# Patient Record
Sex: Female | Born: 1990 | Race: Black or African American | Hispanic: No | Marital: Single | Smoking: Never smoker
Health system: Southern US, Community
[De-identification: ages and names within clinical notes are randomized; demographics above are authoritative.]

## PROBLEM LIST (undated history)

## (undated) DIAGNOSIS — E119 Type 2 diabetes mellitus without complications: Secondary | ICD-10-CM

---

## 2018-11-09 ENCOUNTER — Encounter: Payer: Self-pay | Admitting: Emergency Medicine

## 2018-11-09 ENCOUNTER — Emergency Department
Admission: EM | Admit: 2018-11-09 | Discharge: 2018-11-09 | Disposition: A | Discharge status: Home / Self Care | Payer: Managed Care, Other (non HMO) | Attending: Emergency Medicine | Admitting: Emergency Medicine

## 2018-11-09 ENCOUNTER — Emergency Department: Payer: Managed Care, Other (non HMO)

## 2018-11-09 ENCOUNTER — Other Ambulatory Visit: Payer: Self-pay

## 2018-11-09 DIAGNOSIS — R590 Localized enlarged lymph nodes: Secondary | ICD-10-CM | POA: Insufficient documentation

## 2018-11-09 DIAGNOSIS — Z7984 Long term (current) use of oral hypoglycemic drugs: Secondary | ICD-10-CM | POA: Insufficient documentation

## 2018-11-09 DIAGNOSIS — E119 Type 2 diabetes mellitus without complications: Secondary | ICD-10-CM | POA: Insufficient documentation

## 2018-11-09 DIAGNOSIS — R221 Localized swelling, mass and lump, neck: Secondary | ICD-10-CM | POA: Diagnosis present

## 2018-11-09 HISTORY — DX: Type 2 diabetes mellitus without complications: E11.9

## 2018-11-09 LAB — LIPASE, BLOOD: Lipase: 51 U/L (ref 11–51)

## 2018-11-09 LAB — COMPREHENSIVE METABOLIC PANEL
ALT: 21 U/L (ref 0–44)
AST: 24 U/L (ref 15–41)
Albumin: 3.8 g/dL (ref 3.5–5.0)
Alkaline Phosphatase: 53 U/L (ref 38–126)
Anion gap: 8 (ref 5–15)
BUN: 12 mg/dL (ref 6–20)
CO2: 26 mmol/L (ref 22–32)
Calcium: 9.1 mg/dL (ref 8.9–10.3)
Chloride: 106 mmol/L (ref 98–111)
Creatinine, Ser: 0.78 mg/dL (ref 0.44–1.00)
GFR calc Af Amer: >60 mL/min (ref >60)
Glucose, Bld: 87 mg/dL (ref 70–99)
Potassium: 3.6 mmol/L (ref 3.5–5.1)
Sodium: 140 mmol/L (ref 135–145)
Total Bilirubin: 0.6 mg/dL (ref 0.3–1.2)
Total Protein: 7.6 g/dL (ref 6.5–8.1)

## 2018-11-09 LAB — CBC WITH DIFFERENTIAL/PLATELET
Abs Immature Granulocytes: 0.01 10*3/uL (ref 0.00–0.07)
Basophils Absolute: 0 10*3/uL (ref 0.0–0.1)
Basophils Relative: 0 %
EOS ABS: 0 10*3/uL (ref 0.0–0.5)
Eosinophils Relative: 0 %
HCT: 39.9 % (ref 36.0–46.0)
Hemoglobin: 12.8 g/dL (ref 12.0–15.0)
IMMATURE GRANULOCYTES: 0 %
Lymphocytes Relative: 43 %
Lymphs Abs: 2.3 10*3/uL (ref 0.7–4.0)
MCH: 28.4 pg (ref 26.0–34.0)
MCHC: 32.1 g/dL (ref 30.0–36.0)
MCV: 88.7 fL (ref 80.0–100.0)
Monocytes Absolute: 0.5 10*3/uL (ref 0.1–1.0)
Monocytes Relative: 9 %
NEUTROS PCT: 48 %
Neutro Abs: 2.6 10*3/uL (ref 1.7–7.7)
PLATELETS: 258 10*3/uL (ref 150–400)
RBC: 4.5 MIL/uL (ref 3.87–5.11)
RDW: 12.2 % (ref 11.5–15.5)
WBC: 5.4 10*3/uL (ref 4.0–10.5)
nRBC: 0 % (ref 0.0–0.2)

## 2018-11-09 MED ORDER — PREDNISONE 10 MG PO TABS: 10.0000 mg | ORAL_TABLET | Freq: Every day | ORAL | 0 refills | Status: AC

## 2018-11-09 MED ORDER — IOHEXOL 300 MG/ML  SOLN
100.0000 mL | Freq: Once | INTRAMUSCULAR | Status: AC | PRN
  Administered 2018-11-09: 100 mL via INTRAVENOUS
  Filled 2018-11-09: qty 100

## 2018-11-09 MED ORDER — SODIUM CHLORIDE 0.9 % IV BOLUS
1000.0000 mL | Freq: Once | INTRAVENOUS | Status: AC
  Administered 2018-11-09: 1000 mL via INTRAVENOUS

## 2018-11-09 MED ORDER — IOHEXOL 240 MG/ML SOLN
25.0000 mL | INTRAMUSCULAR | Status: AC
  Administered 2018-11-09: 25 mL via ORAL
  Filled 2018-11-09 (×2): qty 25

## 2018-11-09 MED ORDER — DEXAMETHASONE SODIUM PHOSPHATE 10 MG/ML IJ SOLN
10.0000 mg | Freq: Once | INTRAMUSCULAR | Status: AC
  Administered 2018-11-09: 10 mg via INTRAVENOUS
  Filled 2018-11-09 (×2): qty 1

## 2018-11-09 NOTE — ED Triage Notes (Signed)
PT c/o throat swelling to LFT side x25months, states went to clinic but was sent away and told to follow up. PT speech clear, states some sore throat, denies fever or SOB.

## 2018-11-09 NOTE — ED Notes (Addendum)
See triage note  States she developed swelling to left side of neck several months ago  Was seen and dx'd with lymph node swelling  States she was told that it was nothing to worry about  States swelling is now spreading into neck and shoulder  States she feels pressure with swallowing  Afebrile on arrival

## 2018-11-09 NOTE — ED Provider Notes (Signed)
[facility] Emergency Department Provider Note  ____________________________________________  Time seen: Approximately 6:05 PM  I have reviewed the triage vital signs and the nursing notes.   HISTORY  Chief Complaint No chief complaint on file.    HPI Samantha Flores is a 28 y.o. female who presents the emergency department for evaluation of left-sided "neck mass" that is extending from the left neck into the left supraclavicular region.  Patient reports that she first noticed this area at the start of January, almost 4 months prior.  Patient states that this started with viral URI symptoms.  Viral URI symptoms quickly resolved but she had an ongoing "mass" to left side of the neck.  Patient was seen at a "clinic" and advised that this was likely secondary to her recent infection.  She was advised that if this area worsens she is to follow-up with her primary care.  Patient states that this area has worsened in both regards to size, pain and is now extended into the supraclavicular region.  Patient reports that she has had no submandibular lesions.  She denies any nasal congestion, sore throat, cough, shortness of breath, abdominal pain, nausea or vomiting, diarrhea or constipation.  No recent unexplained weight loss.  No change in bowel habits.  Patient denies any night sweats.  She states that her neck is "sore" to the areas of the lesion but denies any posterior neck stiffness or pain.  No difficulty breathing or swallowing.         Past Medical History:  Diagnosis Date  . Diabetes mellitus without complication (HCC)     There are no active problems to display for this patient.   History reviewed. No pertinent surgical history.  Prior to Admission medications   Medication Sig Start Date End Date Taking? Authorizing Provider  metFORMIN (GLUCOPHAGE) 500 MG tablet Take 500 mg by mouth daily with breakfast.   Yes [provider]     Allergies Nsaids  No family history on file.  Social History Social History   Tobacco Use  . Smoking status: Never Smoker  . Smokeless tobacco: Never Used  Substance Use Topics  . Alcohol use: Not Currently  . Drug use: Never     Review of Systems  Constitutional: No fever/chills Eyes: No visual changes. No discharge ENT: No upper respiratory complaints. Lymphadenopathy: Lesion to the left anterior cervical change extending into the left supraclavicular region. Cardiovascular: no chest pain. Respiratory: no cough. No SOB. Gastrointestinal: No abdominal pain.  No nausea, no vomiting.  No diarrhea.  No constipation. Genitourinary: Negative for dysuria. No hematuria Musculoskeletal: Negative for musculoskeletal pain. Skin: Negative for rash, abrasions, lacerations, ecchymosis. Neurological: Negative for headaches, focal weakness or numbness. 10-point ROS otherwise negative.  ____________________________________________   PHYSICAL EXAM:  VITAL SIGNS: ED Triage Vitals  Enc Vitals Group     BP 11/09/18 1637 127/84     Pulse Rate 11/09/18 1637 88     Resp 11/09/18 1637 16     Temp 11/09/18 1637 98.4 F (36.9 C)     Temp Source 11/09/18 1637 Oral     SpO2 11/09/18 1637 99 %     Weight --      Height --      Head Circumference --      Peak Flow --      Pain Score 11/09/18 1638 8     Pain Loc --      Pain Edu? --      Excl. in  GC? --      Constitutional: Alert and oriented. Well appearing and in no acute distress. Eyes: Conjunctivae are normal. PERRL. EOMI. Head: Atraumatic. ENT:      Ears: EACs and TMs unremarkable bilaterally.      Nose: No congestion/rhinnorhea.      Mouth/Throat: Mucous membranes are moist.  Oropharynx is nonerythematous and nonedematous.  Uvula is midline. Neck: No stridor.  Neck is supple full range of motion Hematological/Lymphatic/Immunilogical: Multiple, firm, tender, nonmobile anterior cervical lymphadenopathy on the left side.   Patient also has palpable appreciable supraclavicular lymphadenopathy.  No lymphadenopathy on the right anterior cervical change.  No supraclavicular lymphadenopathy to the right side. Cardiovascular: Normal rate, regular rhythm. Normal S1 and S2.  Good peripheral circulation. Respiratory: Normal respiratory effort without tachypnea or retractions. Lungs CTAB. Good air entry to the bases with no decreased or absent breath sounds. Gastrointestinal: Bowel sounds 4 quadrants. Soft and nontender to palpation. No guarding or rigidity. No palpable masses. No distention. No CVA tenderness. Musculoskeletal: Full range of motion to all extremities. No gross deformities appreciated. Neurologic:  Normal speech and language. No gross focal neurologic deficits are appreciated.  Skin:  Skin is warm, dry and intact. No rash noted. Psychiatric: Mood and affect are normal. Speech and behavior are normal. Patient exhibits appropriate insight and judgement.   ____________________________________________   LABS (all labs ordered are listed, but only abnormal results are displayed)  Labs Reviewed  COMPREHENSIVE METABOLIC PANEL  LIPASE, BLOOD  CBC WITH DIFFERENTIAL/PLATELET  URINALYSIS, COMPLETE (UACMP) WITH MICROSCOPIC   ____________________________________________  EKG   ____________________________________________  RADIOLOGY I personally viewed and evaluated these images as part of my medical decision making, as well as reviewing the written report by the radiologist.  Ct Soft Tissue Neck W Contrast  Result Date: 11/09/2018 CLINICAL DATA:  LEFT neck swelling for many months, attributed to lymphadenopathy. Dysphagia and worsening neck and shoulder swelling. History of diabetes. EXAM: CT NECK WITH CONTRAST TECHNIQUE: Multidetector CT imaging of the neck was performed using the standard protocol following the bolus administration of intravenous contrast. CONTRAST:  OMNIPAQUE IOHEXOL 300 MG/ML  SOLN  COMPARISON:  None. FINDINGS: PHARYNX AND LARYNX: Normal.  Widely patent airway. SALIVARY GLANDS: Normal. THYROID: Normal. LYMPH NODES: LEFT level-II and periparotid lymphadenopathy measuring to 14 mm short axis. Single hypodense (cystic versus necrotic) LEFT lymph node (series 3, image 45). VASCULAR: Normal. LIMITED INTRACRANIAL: Normal. VISUALIZED ORBITS: Normal. MASTOIDS AND VISUALIZED PARANASAL SINUSES: Subcentimeter maxillary mucosal retention cysts without paranasal sinus air-fluid levels. Mastoid air cells are well aerated. SKELETON: Nonacute. UPPER CHEST: Lung apices are clear. No superior mediastinal lymphadenopathy. OTHER: None. IMPRESSION: 1. Mild LEFT cervical lymphadenopathy, single lymph node with suspicious features. Consider histopathologic correlation. 2. Patent airway. Electronically Signed   By: Awilda Metro M.D.   On: 11/09/2018 20:43   Ct Chest W Contrast  Result Date: 11/09/2018 CLINICAL DATA:  28 year old female with supraclavicular lymphadenopathy and swelling of the left side of the neck. EXAM: CT CHEST, ABDOMEN, AND PELVIS WITH CONTRAST TECHNIQUE: Multidetector CT imaging of the chest, abdomen and pelvis was performed following the standard protocol during bolus administration of intravenous contrast. CONTRAST:  OMNIPAQUE IOHEXOL 300 MG/ML  SOLN COMPARISON:  None. FINDINGS: CT CHEST FINDINGS Cardiovascular: There is no cardiomegaly or pericardial effusion. The thoracic aorta and central pulmonary arteries appear unremarkable. The origins of the great vessels of the aortic arch appear patent as visualized. Mediastinum/Nodes: No hilar or mediastinal adenopathy. The esophagus and the thyroid gland are  grossly unremarkable. Mediastinal fluid collection. No axillary adenopathy. Probable mildly enlarged left supraclavicular lymph node measuring 13 mm in short axis (series 3, image 7). Lungs/Pleura: The lungs are clear. There is no pleural effusion or pneumothorax. The central  airways are patent. Musculoskeletal: No chest wall mass or suspicious bone lesions identified. CT ABDOMEN PELVIS FINDINGS Hepatobiliary: No focal liver abnormality is seen. No gallstones, gallbladder wall thickening, or biliary dilatation. Pancreas: Unremarkable. No pancreatic ductal dilatation or surrounding inflammatory changes. Spleen: Normal in size without focal abnormality. Adrenals/Urinary Tract: Adrenal glands are unremarkable. Kidneys are normal, without renal calculi, focal lesion, or hydronephrosis. Bladder is unremarkable. Stomach/Bowel: There is no bowel obstruction or active inflammation. Normal appendix. Vascular/Lymphatic: No significant vascular findings are present. No enlarged abdominal or pelvic lymph nodes. Reproductive: The uterus is anteverted and grossly unremarkable. The ovaries appear unremarkable as well. No pelvic mass. Other: None Musculoskeletal: No acute or significant osseous findings. IMPRESSION: 1. No acute intrathoracic, abdominal, or pelvic pathology. No CT findings of malignancy or infectious process. 2. Probable mildly enlarged left supraclavicular lymph node. Electronically Signed   By: Elgie Collard M.D.   On: 11/09/2018 20:39   Ct Abdomen Pelvis W Contrast  Result Date: 11/09/2018 CLINICAL DATA:  28 year old female with supraclavicular lymphadenopathy and swelling of the left side of the neck. EXAM: CT CHEST, ABDOMEN, AND PELVIS WITH CONTRAST TECHNIQUE: Multidetector CT imaging of the chest, abdomen and pelvis was performed following the standard protocol during bolus administration of intravenous contrast. CONTRAST:  OMNIPAQUE IOHEXOL 300 MG/ML  SOLN COMPARISON:  None. FINDINGS: CT CHEST FINDINGS Cardiovascular: There is no cardiomegaly or pericardial effusion. The thoracic aorta and central pulmonary arteries appear unremarkable. The origins of the great vessels of the aortic arch appear patent as visualized. Mediastinum/Nodes: No hilar or mediastinal  adenopathy. The esophagus and the thyroid gland are grossly unremarkable. Mediastinal fluid collection. No axillary adenopathy. Probable mildly enlarged left supraclavicular lymph node measuring 13 mm in short axis (series 3, image 7). Lungs/Pleura: The lungs are clear. There is no pleural effusion or pneumothorax. The central airways are patent. Musculoskeletal: No chest wall mass or suspicious bone lesions identified. CT ABDOMEN PELVIS FINDINGS Hepatobiliary: No focal liver abnormality is seen. No gallstones, gallbladder wall thickening, or biliary dilatation. Pancreas: Unremarkable. No pancreatic ductal dilatation or surrounding inflammatory changes. Spleen: Normal in size without focal abnormality. Adrenals/Urinary Tract: Adrenal glands are unremarkable. Kidneys are normal, without renal calculi, focal lesion, or hydronephrosis. Bladder is unremarkable. Stomach/Bowel: There is no bowel obstruction or active inflammation. Normal appendix. Vascular/Lymphatic: No significant vascular findings are present. No enlarged abdominal or pelvic lymph nodes. Reproductive: The uterus is anteverted and grossly unremarkable. The ovaries appear unremarkable as well. No pelvic mass. Other: None Musculoskeletal: No acute or significant osseous findings. IMPRESSION: 1. No acute intrathoracic, abdominal, or pelvic pathology. No CT findings of malignancy or infectious process. 2. Probable mildly enlarged left supraclavicular lymph node. Electronically Signed   By: Elgie Collard M.D.   On: 11/09/2018 20:39    ____________________________________________    PROCEDURES  Procedure(s) performed:    Procedures    Medications  iohexol (OMNIPAQUE) 240 MG/ML injection 25 mL (25 mLs Oral Contrast Given 11/09/18 1831)  dexamethasone (DECADRON) injection 10 mg (has no administration in time range)  sodium chloride 0.9 % bolus 1,000 mL (1,000 mLs Intravenous New Bag/Given 11/09/18 1855)  iohexol (OMNIPAQUE) 300 MG/ML  solution 100 mL (100 mLs Intravenous Contrast Given 11/09/18 2007)     ____________________________________________   INITIAL IMPRESSION /  ASSESSMENT AND PLAN / ED COURSE  Pertinent labs & imaging results that were available during my care of the patient were reviewed by me and considered in my medical decision making (see chart for details).  Review of the [place] CSRS was performed in accordance of the [facility] prior to dispensing any controlled drugs.           Patient's diagnosis is consistent with lymphadenopathy.  Patient presents emergency department with a complaint of neck mass.  On exam, patient had findings consistent with enlarged lymph nodes to the left cervical and left supraclavicular region.  Patient was evaluated with labs and imaging.  Overall, results were reassuring.  No indication of infectious origin or malignant origin of lymphadenopathy.  At this time, I will place the patient on a short course of steroids to see if this improves lymphadenopathy.  If there is no change or if there is a worsening patient will follow-up with ENT for further evaluation.  No further evaluation at this time..  Patient is given ED precautions to return to the ED for any worsening or new symptoms.     ____________________________________________  FINAL CLINICAL IMPRESSION(S) / ED DIAGNOSES  Final diagnoses:  Lymphadenopathy, supraclavicular      NEW MEDICATIONS STARTED DURING THIS VISIT:  ED Discharge Orders    None          This chart was dictated using voice recognition software/Dragon. Despite best efforts to proofread, errors can occur which can change the meaning. Any change was purely unintentional.    Racheal Patches, PA-C 11/09/18 2116    Minna Antis, MD 11/09/18 2124

## 2018-12-30 ENCOUNTER — Telehealth: Payer: Managed Care, Other (non HMO) | Admitting: Family

## 2018-12-30 DIAGNOSIS — R197 Diarrhea, unspecified: Secondary | ICD-10-CM

## 2018-12-30 DIAGNOSIS — H538 Other visual disturbances: Secondary | ICD-10-CM

## 2018-12-30 NOTE — Progress Notes (Signed)
Based on what you shared with me, I feel your condition warrants further evaluation and I recommend that you be seen for a face to face office visit.  Given your symptoms of blurred vision that has been going on since 4/1 you need to be seen face to face.   NOTE: If you entered your credit card information for this eVisit, you will not be charged. You may see a "hold" on your card for the $35 but that hold will drop off and you will not have a charge processed.  If you are having a true medical emergency please call 911.  If you need an urgent fa face to face visit, [place] has four urgent care centers for your convenience.    PLEASE NOTE: THE [facility] LOCATIONS AND URGENT CARE CLINICS DO NOT HAVE THE TESTING FOR CORONAVIRUS COVID19 AVAILABLE.  IF YOU FEEL YOU NEED THIS TEST YOU MUST GO TO A TRIAGE LOCATION AT ONE OF THE HOSPITAL EMERGENCY DEPARTMENTS   [email or web address] to reserve your spot online an avoid wait times  [facility] [address] [place], [place] [address] Modified hours of operation: Monday-Friday, 12 PM to 6 PM  Saturday & Sunday 10 AM to 4 PM *Across the street from [employer]  [facility] (New Address!) [address] [place], [place] [address] *Just off [employer], across the road from [place]* Modified hours of operation: Monday-Friday, 12 PM to 6 PM  Closed Saturday & Sunday  [facility]'s modified hours of operation will be in effect from May 1 until May 31   The following sites will take your insurance:  . [facility]  [phone] Get Driving Directions Find a Provider at this Location  [address] [place], [place] [address] . 10 am to 8 pm Monday-Friday . 12 pm to 8 pm Saturday-Sunday   . [facility]  [phone] Get Driving Directions Find a Provider at this Location  [address]  [place], [place] [address] . 8 am to 8 pm Monday-Friday . 9 am to 6 pm Saturday . 11 am to 6 pm Sunday   . [facility]  [phone] Get Driving Directions  [address] [place], [place] [address] . 8 am to 8 pm Monday-Friday . 8 am to 4 pm Saturday-Sunday   Your e-visit answers were reviewed by a board certified advanced clinical practitioner to complete your personal care plan.  Thank you for using e-Visits.

## 2019-10-12 ENCOUNTER — Ambulatory Visit: Payer: Self-pay | Attending: Internal Medicine

## 2019-10-12 DIAGNOSIS — Z23 Encounter for immunization: Secondary | ICD-10-CM | POA: Insufficient documentation

## 2019-10-12 NOTE — Progress Notes (Signed)
   Covid-19 Vaccination Clinic  Name:  Samantha Flores    MRN: [id number] DOB: November 08, 1990  10/12/2019  [name] was observed post Covid-19 immunization for 30 minutes without incident. She was provided with Vaccine Information Sheet and instruction to access the V-Safe system.   [name] was instructed to call 911 with any severe reactions post vaccine: Marland Kitchen Difficulty breathing  . Swelling of face and throat  . A fast heartbeat  . A bad rash all over body  . Dizziness and weakness   Immunizations Administered    Name Date Dose VIS Date Route   Pfizer COVID-19 Vaccine 10/12/2019 12:05 PM 0.3 mL 07/23/2019 Intramuscular   Manufacturer: [employer], [employer]   Lot: [id number]   NDC: [address]-8

## 2019-11-02 ENCOUNTER — Ambulatory Visit: Payer: Medicaid Other | Attending: Internal Medicine

## 2019-11-02 DIAGNOSIS — Z23 Encounter for immunization: Secondary | ICD-10-CM

## 2019-11-02 NOTE — Progress Notes (Signed)
   Covid-19 Vaccination Clinic  Name:  Samantha Flores    MRN: [id number] DOB: 08-24-1990  11/02/2019  [name] was observed post Covid-19 immunization for 30 minutes without incident. She was provided with Vaccine Information Sheet and instruction to access the V-Safe system.   [name] was instructed to call 911 with any severe reactions post vaccine: Marland Kitchen Difficulty breathing  . Swelling of face and throat  . A fast heartbeat  . A bad rash all over body  . Dizziness and weakness   Immunizations Administered    Name Date Dose VIS Date Route   Pfizer COVID-19 Vaccine 11/02/2019  3:53 PM 0.3 mL 07/23/2019 Intramuscular   Manufacturer: [employer], [employer]   Lot: [id number]   NDC: [address]-8

## 2019-11-15 IMAGING — CT CT ABDOMEN AND PELVIS WITH CONTRAST
2 of 4 series · 14 of 46 positions shown, 16 images · IV contrast (omnipaque)
Comparison: None.

CLINICAL DATA: 28-year-old female with supraclavicular
lymphadenopathy and swelling of the left side of the neck.

EXAM:
CT CHEST, ABDOMEN, AND PELVIS WITH CONTRAST
TECHNIQUE: Multidetector CT imaging of the chest, abdomen and pelvis was
performed following the standard protocol during bolus
administration of intravenous contrast.
CONTRAST:  100mL OMNIPAQUE IOHEXOL 300 MG/ML  SOLN

[Series 3: cap with · axial · 0.73mm/px · z∈[-882,-327]mm · 11 of 131 slices shown, 13 images]
[im 10/131  soft-tissue]
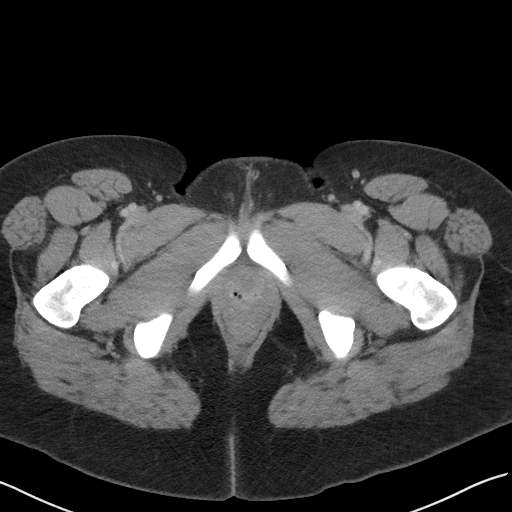
[im 10/131  bone]
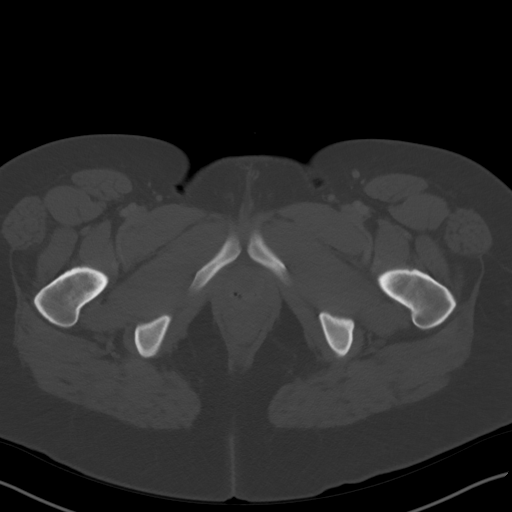
[im 19/131  soft-tissue]
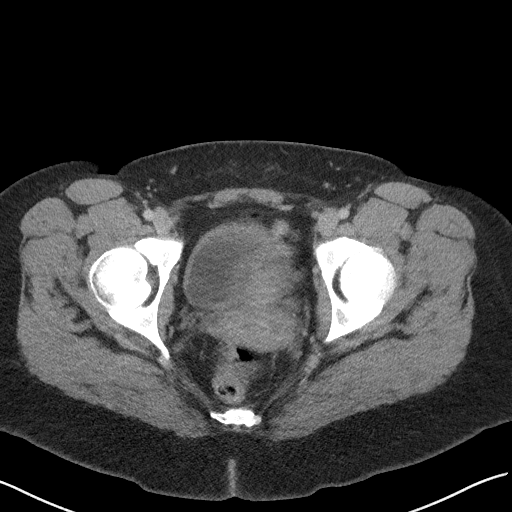
[im 28/131  soft-tissue]
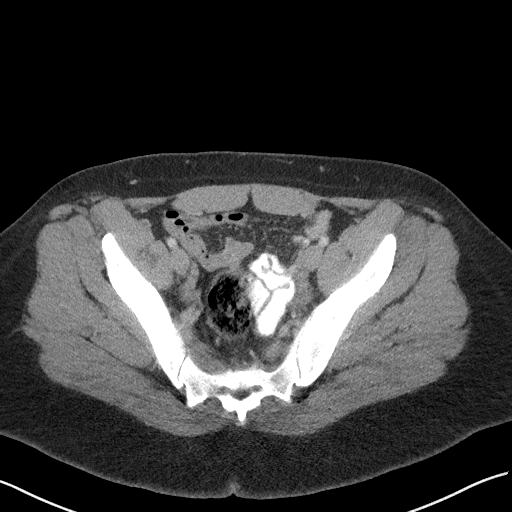
[im 47/131  soft-tissue]
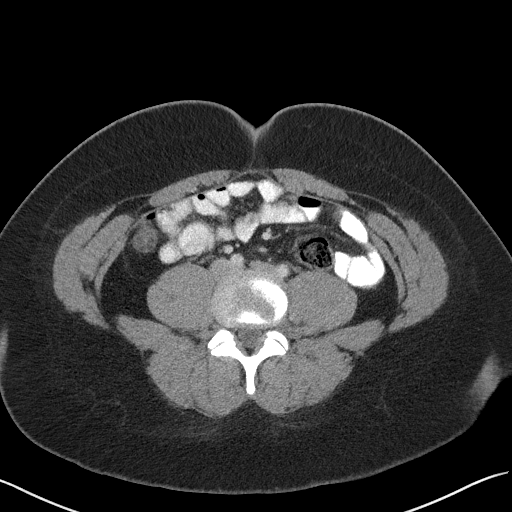
[im 56/131  soft-tissue]
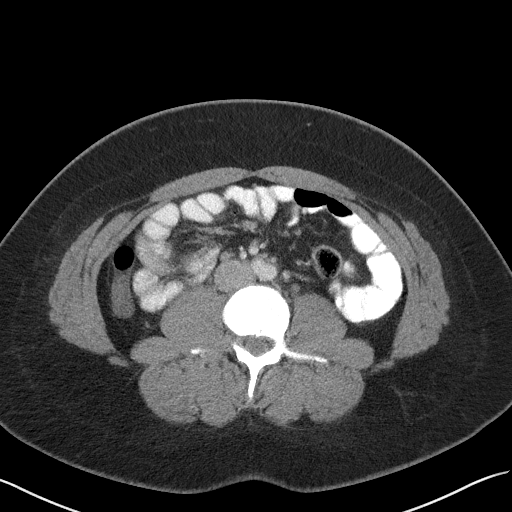
[im 66/131  soft-tissue]
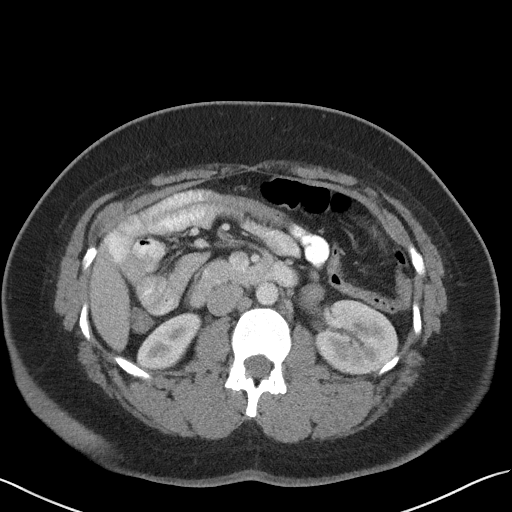
[im 75/131  soft-tissue]
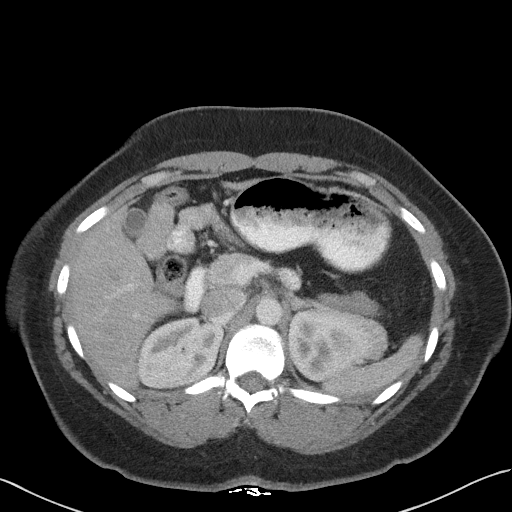
[im 84/131  soft-tissue]
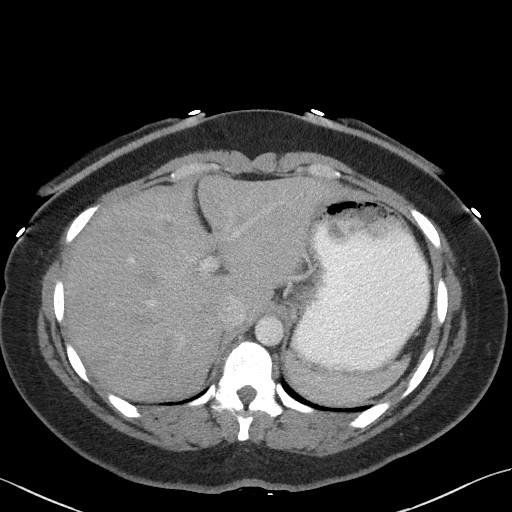
[im 103/131  soft-tissue]
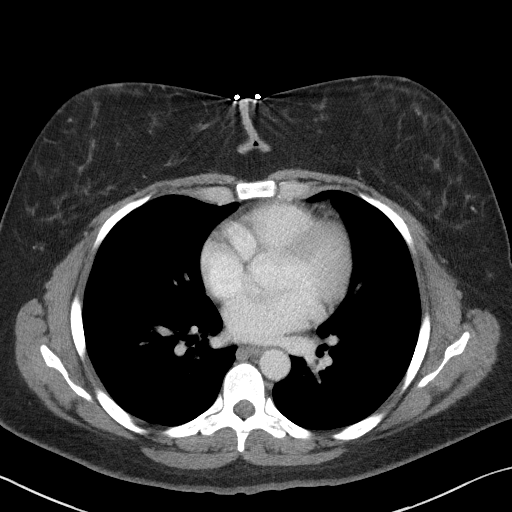
[im 103/131  bone]
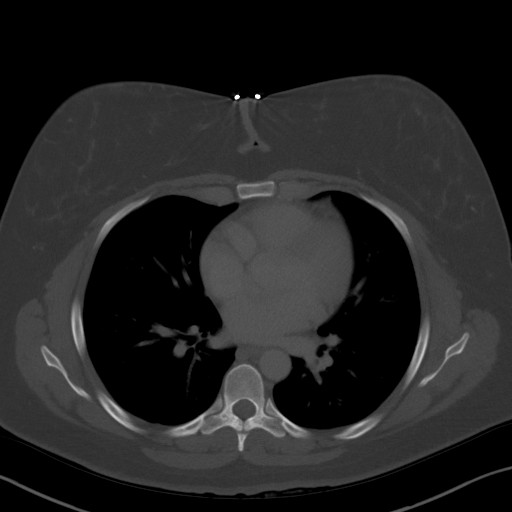
[im 112/131  soft-tissue]
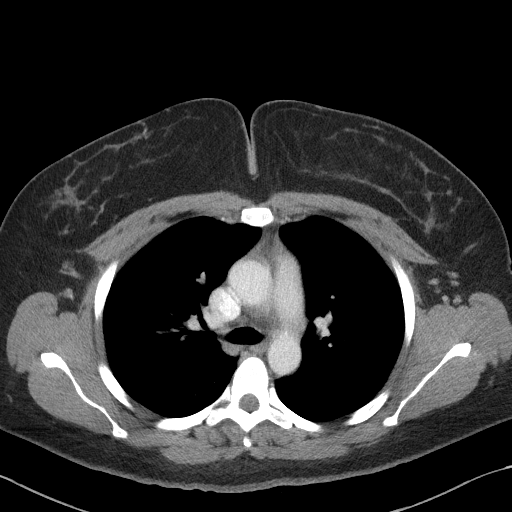
[im 121/131  soft-tissue]
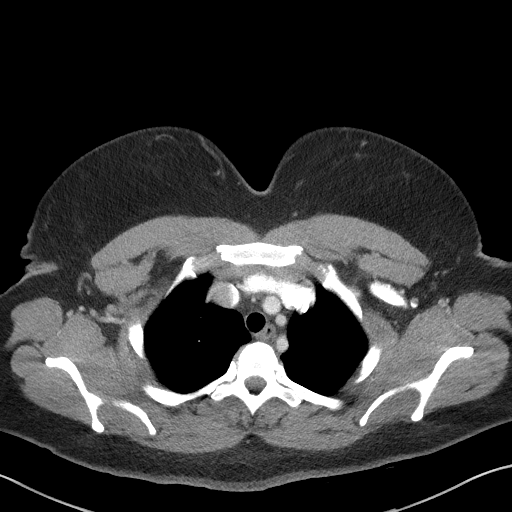

[Series 6: coronals · coronal · 0.74mm/px · 3 of 137 slices shown]
[im 46/137  soft-tissue]
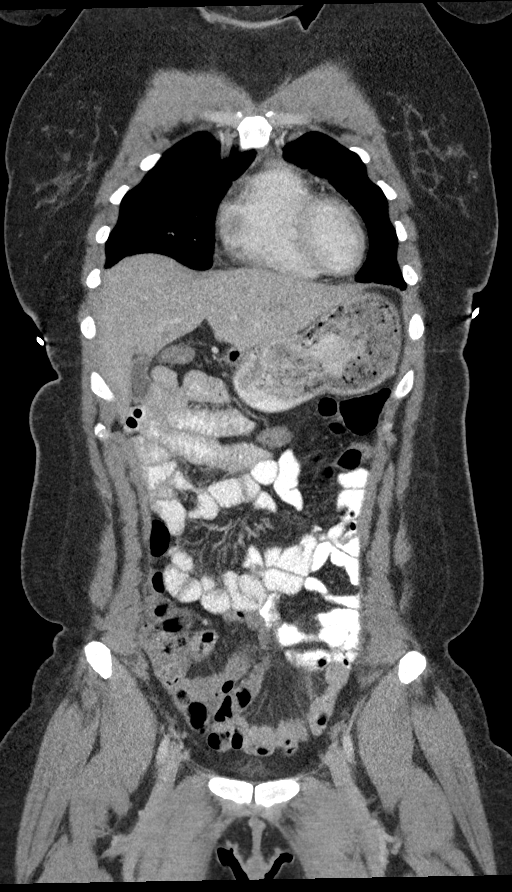
[im 61/137  soft-tissue]
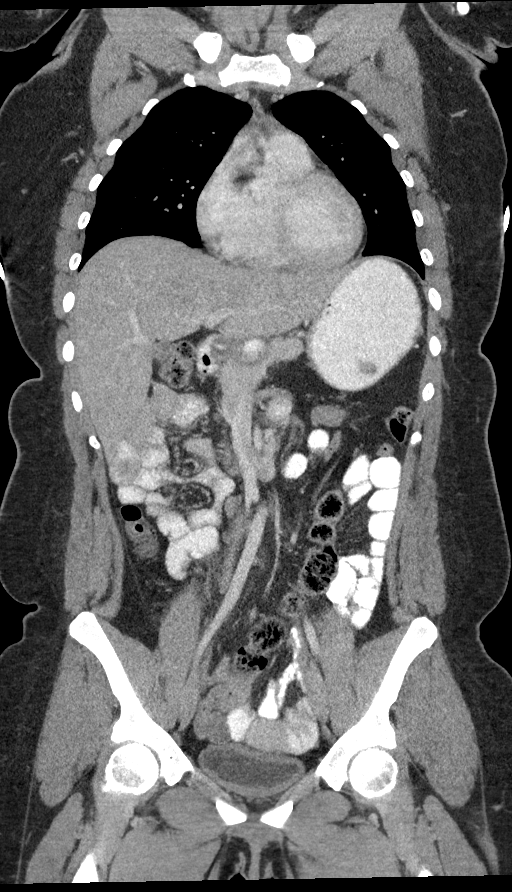
[im 76/137  soft-tissue]
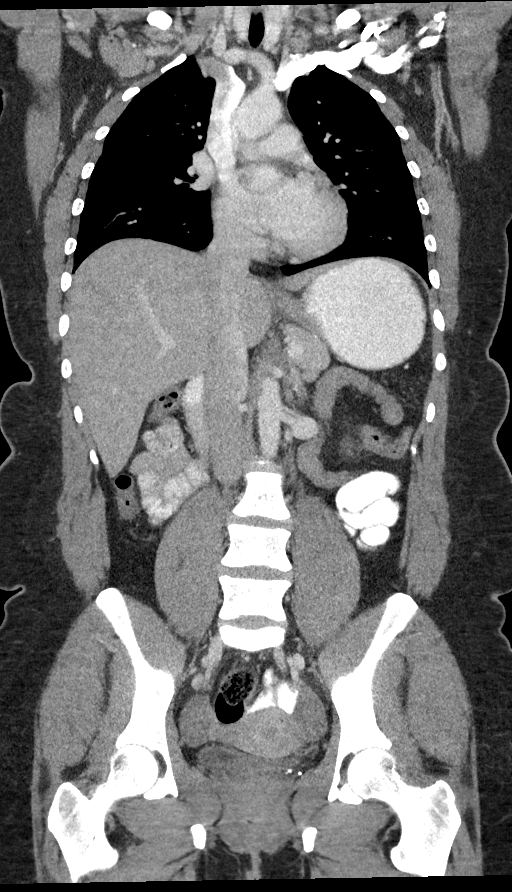

[14 of 46 positions shown; findings below may reference images not displayed]

FINDINGS: CT CHEST FINDINGS

Cardiovascular: There is no cardiomegaly or pericardial effusion.
The thoracic aorta and central pulmonary arteries appear
unremarkable. The origins of the great vessels of the aortic arch
appear patent as visualized.

Mediastinum/Nodes: No hilar or mediastinal adenopathy. The esophagus
and the thyroid gland are grossly unremarkable. Mediastinal fluid
collection. No axillary adenopathy. Probable mildly enlarged left
supraclavicular lymph node measuring 13 mm in short axis (series 3,
image 7).

Lungs/Pleura: The lungs are clear. There is no pleural effusion or
pneumothorax. The central airways are patent.

Musculoskeletal: No chest wall mass or suspicious bone lesions
identified.

CT ABDOMEN PELVIS FINDINGS

Hepatobiliary: No focal liver abnormality is seen. No gallstones,
gallbladder wall thickening, or biliary dilatation.

Pancreas: Unremarkable. No pancreatic ductal dilatation or
surrounding inflammatory changes.

Spleen: Normal in size without focal abnormality.

Adrenals/Urinary Tract: Adrenal glands are unremarkable. Kidneys are
normal, without renal calculi, focal lesion, or hydronephrosis.
Bladder is unremarkable.

Stomach/Bowel: There is no bowel obstruction or active inflammation.
Normal appendix.

Vascular/Lymphatic: No significant vascular findings are present. No
enlarged abdominal or pelvic lymph nodes.

Reproductive: The uterus is anteverted and grossly unremarkable. The
ovaries appear unremarkable as well. No pelvic mass.

Other: None

Musculoskeletal: No acute or significant osseous findings.
IMPRESSION: 1. No acute intrathoracic, abdominal, or pelvic pathology. No CT
findings of malignancy or infectious process.
2. Probable mildly enlarged left supraclavicular lymph node.

## 2021-03-06 ENCOUNTER — Other Ambulatory Visit: Payer: Self-pay

## 2021-03-06 ENCOUNTER — Emergency Department: Payer: Medicaid Other

## 2021-03-06 ENCOUNTER — Emergency Department
Admission: EM | Admit: 2021-03-06 | Discharge: 2021-03-06 | Disposition: A | Discharge status: Home / Self Care | Payer: Medicaid Other | Attending: Student in an Organized Health Care Education/Training Program | Admitting: Student in an Organized Health Care Education/Training Program

## 2021-03-06 DIAGNOSIS — L03011 Cellulitis of right finger: Secondary | ICD-10-CM

## 2021-03-06 DIAGNOSIS — E119 Type 2 diabetes mellitus without complications: Secondary | ICD-10-CM | POA: Insufficient documentation

## 2021-03-06 DIAGNOSIS — Z7984 Long term (current) use of oral hypoglycemic drugs: Secondary | ICD-10-CM | POA: Insufficient documentation

## 2021-03-06 LAB — POC URINE PREG, ED: Preg Test, Ur: NEGATIVE

## 2021-03-06 MED ORDER — ACETAMINOPHEN 500 MG PO TABS
1000.0000 mg | ORAL_TABLET | Freq: Once | ORAL | Status: AC
  Administered 2021-03-06: 1000 mg via ORAL
  Filled 2021-03-06: qty 2

## 2021-03-06 MED ORDER — LIDOCAINE HCL (PF) 1 % IJ SOLN
5.0000 mL | Freq: Once | INTRAMUSCULAR | Status: AC
  Administered 2021-03-06: 5 mL via INTRADERMAL
  Filled 2021-03-06: qty 5

## 2021-03-06 MED ORDER — SULFAMETHOXAZOLE-TRIMETHOPRIM 800-160 MG PO TABS
1.0000 | ORAL_TABLET | Freq: Once | ORAL | Status: AC
  Administered 2021-03-06: 1 via ORAL
  Filled 2021-03-06: qty 1

## 2021-03-06 MED ORDER — CEPHALEXIN 500 MG PO CAPS: 500.0000 mg | ORAL_CAPSULE | Freq: Four times a day (QID) | ORAL | 0 refills | Status: AC

## 2021-03-06 MED ORDER — SULFAMETHOXAZOLE-TRIMETHOPRIM 800-160 MG PO TABS: 1.0000 | ORAL_TABLET | Freq: Once | ORAL | Status: DC

## 2021-03-06 MED ORDER — SULFAMETHOXAZOLE-TRIMETHOPRIM 800-160 MG PO TABS: 1.0000 | ORAL_TABLET | Freq: Two times a day (BID) | ORAL | 0 refills | Status: DC

## 2021-03-06 MED ORDER — CEPHALEXIN 500 MG PO CAPS
1000.0000 mg | ORAL_CAPSULE | Freq: Once | ORAL | Status: AC
  Administered 2021-03-06: 1000 mg via ORAL
  Filled 2021-03-06: qty 2

## 2021-03-06 MED ORDER — CEPHALEXIN 500 MG PO CAPS: 500.0000 mg | ORAL_CAPSULE | Freq: Four times a day (QID) | ORAL | 0 refills | Status: DC

## 2021-03-06 MED ORDER — CEPHALEXIN 500 MG PO CAPS: 1000.0000 mg | ORAL_CAPSULE | Freq: Once | ORAL | Status: DC

## 2021-03-06 MED ORDER — SULFAMETHOXAZOLE-TRIMETHOPRIM 800-160 MG PO TABS: 1.0000 | ORAL_TABLET | Freq: Two times a day (BID) | ORAL | 0 refills | Status: AC

## 2021-03-06 NOTE — ED Notes (Addendum)
See triage note  Presents with pain and swelling to right index finger  States she noticed some pain and swelling couple of weeks ago

## 2021-03-06 NOTE — Discharge Instructions (Addendum)
Please follow-up with orthopedics in the next 2 to 3 days.  Return to the emergency department if you experience any worsening of swelling, particularly if it is tracking towards her hand, or if you develop fever, chills or other symptoms.  Please take your antibiotics as prescribed.  You may use Tylenol, up to 1000 mg 4 times daily as needed for pain.

## 2021-03-06 NOTE — ED Provider Notes (Signed)
[facility] Emergency Department Provider Note  ____________________________________________   Event Date/Time   First MD Initiated Contact with Patient 03/06/21 1103     (approximate)  I have reviewed the triage vital signs and the nursing notes.   HISTORY  Chief Complaint Finger Injury   HPI Samantha Flores is a 30 y.o. female who presents to the ER for evaluation of pain and swelling to the right index finger. Patient states she has been having discomfort for the last 3 weeks, but significantly worsened in the last 2 days. She states she was having discomfort but still went to the nail salon, had her nails done and feels it was worse. She noticed this morning that in addition to the swelling, she was having pus drain from under her nail, and she self-removed her acrylic nail. Upon doing so, she noticed a hole present at the distal end of her natural nail that was draining pus. It continued to drain, causing her to present here. She denies fever, chills, or other systemic symptoms.         Past Medical History:  Diagnosis Date   Diabetes mellitus without complication (HCC)     There are no problems to display for this patient.   History reviewed. No pertinent surgical history.  Prior to Admission medications   Medication Sig Start Date End Date Taking? Authorizing Provider  cephALEXin (KEFLEX) 500 MG capsule Take 1 capsule (500 mg total) by mouth 4 (four) times daily for 10 days. 03/06/21 03/16/21 Yes Cuca Benassi, Ruben Gottron, PA  sulfamethoxazole-trimethoprim (BACTRIM DS) 800-160 MG tablet Take 1 tablet by mouth 2 (two) times daily for 10 days. 03/06/21 03/16/21 Yes Catrell Morrone, Ruben Gottron, PA  metFORMIN (GLUCOPHAGE) 500 MG tablet Take 500 mg by mouth daily with breakfast.    [provider]  predniSONE (DELTASONE) 10 MG tablet Take 1 tablet (10 mg total) by mouth daily. 11/09/18   Cuthriell, Delorise Royals, PA-C    Allergies Nsaids  No family history on  file.  Social History Social History   Tobacco Use   Smoking status: Never   Smokeless tobacco: Never  Substance Use Topics   Alcohol use: Not Currently   Drug use: Never    Review of Systems Constitutional: No fever/chills Eyes: No visual changes. ENT: No sore throat. Cardiovascular: Denies chest pain. Respiratory: Denies shortness of breath. Gastrointestinal: No abdominal pain.  No nausea, no vomiting.  No diarrhea.  No constipation. Genitourinary: Negative for dysuria. Musculoskeletal: +right index finger pain Skin: Negative for rash. Neurological: Negative for headaches, focal weakness or numbness.  ____________________________________________   PHYSICAL EXAM:  VITAL SIGNS: ED Triage Vitals [03/06/21 0952]  Enc Vitals Group     BP 131/88     Pulse Rate 89     Resp 18     Temp 98.8 F (37.1 C)     Temp Source Oral     SpO2 100 %     Weight      Height      Head Circumference      Peak Flow      Pain Score      Pain Loc      Pain Edu?      Excl. in GC?    Constitutional: Alert and oriented. Well appearing and in no acute distress. Eyes: Conjunctivae are normal. PERRL. EOMI. Head: Atraumatic. Mouth/Throat: Mucous membranes are moist.  Oropharynx non-erythematous. Neck: No stridor.  Cardiovascular: Normal rate, regular rhythm. Grossly normal heart sounds.  Good peripheral circulation. Respiratory: Normal respiratory effort.  No retractions. Lungs CTAB. Gastrointestinal: Soft and nontender. No distention. No abdominal bruits. No CVA tenderness. Musculoskeletal: There is tenderness and edema to the distal right index finger. The swelling does not approach proximal to the DIP. There is no erythema. The distal nail is jagged with burr-hole appearance with active pustular drainage. There is tenderness under the volar finger pad, and pressure on this area expresses additional pus from the nail. No inflammation or pus noted under the cuticle. Full ROM present of the  PIP and DIP.  Neurologic:  Normal speech and language. No gross focal neurologic deficits are appreciated. No gait instability. Skin:  Skin is warm, dry and intact. No rash noted. Psychiatric: Mood and affect are normal. Speech and behavior are normal.  ____________________________________________  RADIOLOGY I, Lucy Chris, personally viewed and evaluated these images (plain radiographs) as part of my medical decision making, as well as reviewing the written report by the radiologist.  ED provider interpretation:  no evidence of foreign body or osteomyelitis  Official radiology report(s): DG Finger Index Right  Result Date: 03/06/2021 CLINICAL DATA:  Felon of the right index digit, rule out foreign body EXAM: RIGHT INDEX FINGER 2+V COMPARISON:  None. FINDINGS: There is no evidence of fracture or dislocation. There is no evidence of arthropathy or other focal bone abnormality. Diffuse soft tissue edema about the index digit. IMPRESSION: No fracture or dislocation of the right index digit. No radiopaque foreign body. Diffuse soft tissue edema. Electronically Signed   By: Lauralyn Primes M.D.   On: 03/06/2021 12:03    ____________________________________________   PROCEDURES  Procedure(s) performed (including Critical Care):  Marland KitchenMarland KitchenIncision and Drainage  Date/Time: 03/06/2021 2:28 PM Performed by: Lucy Chris, PA Authorized by: Lucy Chris, PA   Consent:    Consent obtained:  Verbal   Consent given by:  Patient   Risks, benefits, and alternatives were discussed: yes     Risks discussed:  Bleeding, incomplete drainage, pain, damage to other organs and infection   Alternatives discussed:  No treatment, referral and delayed treatment Universal protocol:    Procedure explained and questions answered to patient or proxy's satisfaction: yes     Imaging studies available: yes     Required blood products, implants, devices, and special equipment available: yes     Immediately  prior to procedure, a time out was called: yes     Patient identity confirmed:  Verbally with patient Location:    Type:  Abscess (right index felon)   Location:  Upper extremity   Upper extremity location:  Finger   Finger location:  R index finger Pre-procedure details:    Skin preparation:  Povidone-iodine Sedation:    Sedation type:  None Anesthesia:    Anesthesia method:  Nerve block   Block location:  Right index digital block   Block needle gauge:  30 G   Block anesthetic:  Lidocaine 1% w/o epi   Block technique:  Digital block   Block injection procedure:  Anatomic landmarks identified, anatomic landmarks palpated, introduced needle, incremental injection and negative aspiration for blood   Block outcome:  Anesthesia achieved Procedure type:    Complexity:  Simple Procedure details:    Incision types:  Single straight   Wound management:  Probed and deloculated   Drainage:  Bloody   Drainage amount:  Scant   Wound treatment:  Wound left open   Packing materials:  None Post-procedure details:  Procedure completion:  Tolerated well, no immediate complications Comments:     A 1.5 cm incision was made from the mid lateral approach on the ulnar aspect of the right index finger.  Minimal purulence able to be expressed.  Soaked for 15 min after incision in betadine, covered in xeroform gauze and kerlix.   ____________________________________________   INITIAL IMPRESSION / ASSESSMENT AND PLAN / ED COURSE  As part of my medical decision making, I reviewed the following data within the electronic MEDICAL RECORD NUMBER Nursing notes reviewed and incorporated, Radiograph reviewed, and Notes from prior ED visits        Patient is a 30 yo female who reports to the ER for evaluation of right index finger pain and swelling that has been worsening over the last 3 weeks. See HPI for further details. In triage, patient has normal vitals. PE as above.  Suspicious for paronychia that has  developed into felon given the amount of swelling, tenderness and now pustular drainage but with minimal cuticle involvement.  Discussed risks and benefits of I&D, and the patient is amenable with this plan.  See procedure note above.  Patient was initiated on both Keflex and Bactrim to cover a wide variety of organisms including most strep and staph species.  Patient will take Tylenol for pain, as she has an NSAID allergy.  Discussed very close follow-up with orthopedics, preferably in 2 days to ensure this is improving.  Patient is amenable with this plan, return precautions were discussed and she stable this time for outpatient follow-up.      ____________________________________________   FINAL CLINICAL IMPRESSION(S) / ED DIAGNOSES  Final diagnoses:  Felon of finger of right hand     ED Discharge Orders          Ordered    sulfamethoxazole-trimethoprim (BACTRIM DS) 800-160 MG tablet  2 times daily        03/06/21 1326    cephALEXin (KEFLEX) 500 MG capsule  4 times daily        03/06/21 1326             Note:  This document was prepared using Dragon voice recognition software and may include unintentional dictation errors.    Lucy Chris, PA 03/06/21 1544    Willy Eddy, MD 03/06/21 [id number]

## 2021-03-06 NOTE — ED Triage Notes (Signed)
Pt c/o swelling and pain to the right pointer finger that started around the cuticle about 3 weeks ago.

## 2022-03-12 IMAGING — DX DG FINGER INDEX 2+V*R*
3 series · 3 of 3 positions shown · non-contrast
Comparison: None.

CLINICAL DATA: Felon of the right index digit, rule out foreign
body

EXAM:
RIGHT INDEX FINGER 2+V

[finger ap]
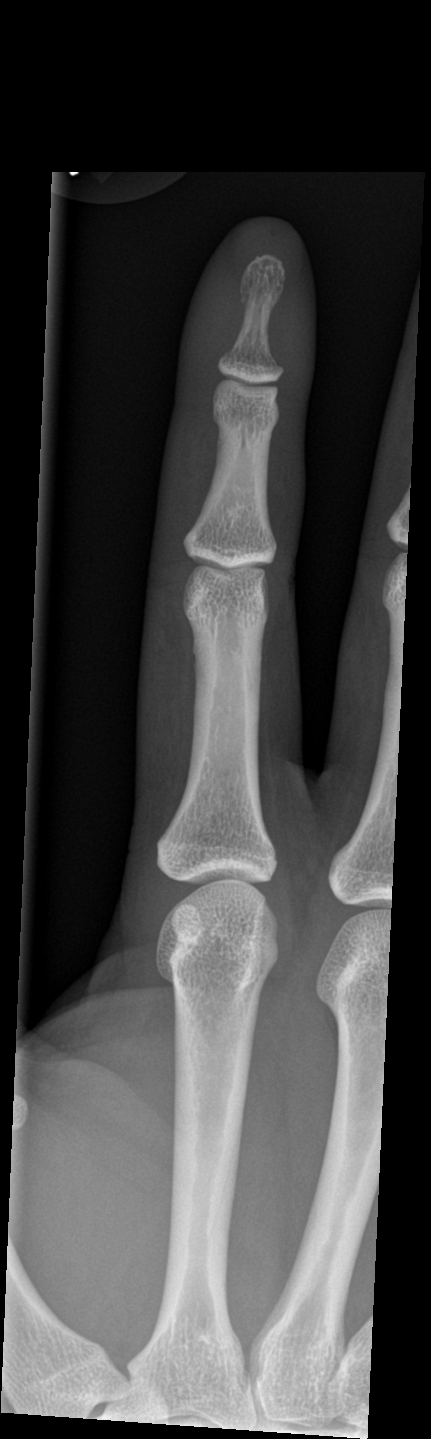

[finger obl]
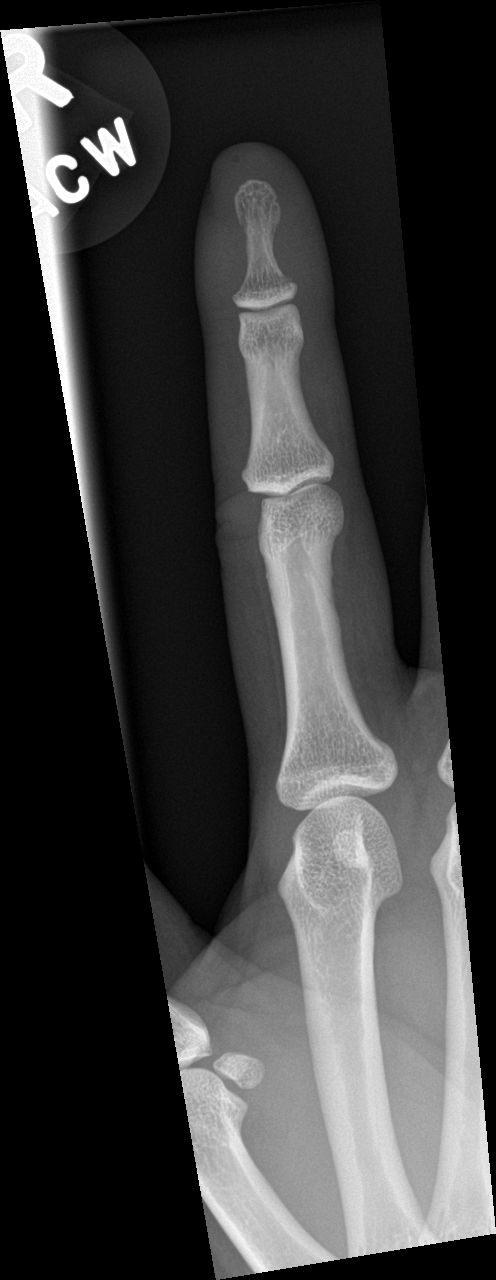

[finger lat]
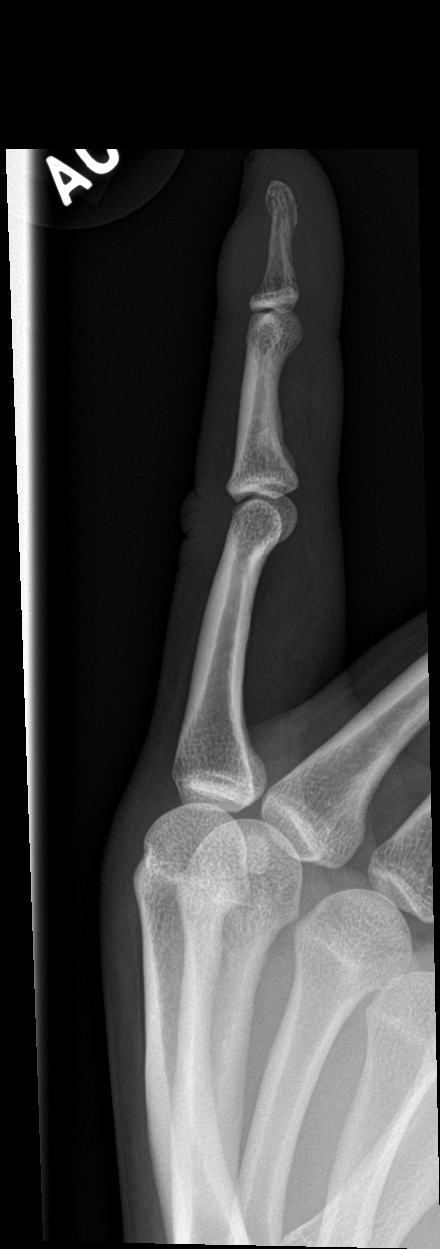

[3 of 3 positions shown; findings below may reference images not displayed]

FINDINGS: There is no evidence of fracture or dislocation. There is no
evidence of arthropathy or other focal bone abnormality. Diffuse
soft tissue edema about the index digit.
IMPRESSION: No fracture or dislocation of the right index digit. No radiopaque
foreign body. Diffuse soft tissue edema.
